# Patient Record
Sex: Female | Born: 1960 | Race: White | Hispanic: No | Marital: Married | Smoking: Never smoker
Health system: Southern US, Community
[De-identification: ages and names within clinical notes are randomized; demographics above are authoritative.]

## PROBLEM LIST (undated history)

## (undated) DIAGNOSIS — E785 Hyperlipidemia, unspecified: Secondary | ICD-10-CM

## (undated) DIAGNOSIS — R0602 Shortness of breath: Secondary | ICD-10-CM

## (undated) DIAGNOSIS — R002 Palpitations: Secondary | ICD-10-CM

## (undated) DIAGNOSIS — E079 Disorder of thyroid, unspecified: Secondary | ICD-10-CM

## (undated) DIAGNOSIS — I10 Essential (primary) hypertension: Secondary | ICD-10-CM

## (undated) HISTORY — DX: Shortness of breath: R06.02

## (undated) HISTORY — PX: KNEE ARTHROSCOPY: SUR90

## (undated) HISTORY — DX: Palpitations: R00.2

---

## 2018-05-11 ENCOUNTER — Emergency Department (HOSPITAL_COMMUNITY)
Admission: EM | Admit: 2018-05-11 | Discharge: 2018-05-11 | Disposition: A | Discharge status: Home / Self Care | Payer: BLUE CROSS/BLUE SHIELD | Attending: Emergency Medicine | Admitting: Emergency Medicine

## 2018-05-11 ENCOUNTER — Encounter (HOSPITAL_COMMUNITY): Payer: Self-pay | Admitting: Emergency Medicine

## 2018-05-11 ENCOUNTER — Other Ambulatory Visit: Payer: Self-pay

## 2018-05-11 ENCOUNTER — Emergency Department (HOSPITAL_COMMUNITY): Payer: BLUE CROSS/BLUE SHIELD

## 2018-05-11 DIAGNOSIS — I10 Essential (primary) hypertension: Secondary | ICD-10-CM | POA: Diagnosis not present

## 2018-05-11 DIAGNOSIS — R42 Dizziness and giddiness: Secondary | ICD-10-CM

## 2018-05-11 DIAGNOSIS — Z79899 Other long term (current) drug therapy: Secondary | ICD-10-CM | POA: Diagnosis not present

## 2018-05-11 HISTORY — DX: Essential (primary) hypertension: I10

## 2018-05-11 HISTORY — DX: Hyperlipidemia, unspecified: E78.5

## 2018-05-11 HISTORY — DX: Disorder of thyroid, unspecified: E07.9

## 2018-05-11 LAB — CBC WITH DIFFERENTIAL/PLATELET
Basophils Absolute: 0 10*3/uL (ref 0.0–0.1)
Basophils Relative: 0 %
EOS PCT: 1 %
Eosinophils Absolute: 0.1 10*3/uL (ref 0.0–0.7)
HCT: 38 % (ref 36.0–46.0)
Hemoglobin: 12.4 g/dL (ref 12.0–15.0)
Lymphocytes Relative: 18 %
Lymphs Abs: 1.1 10*3/uL (ref 0.7–4.0)
MCH: 28.8 pg (ref 26.0–34.0)
MCHC: 32.6 g/dL (ref 30.0–36.0)
MCV: 88.4 fL (ref 78.0–100.0)
Monocytes Absolute: 0.4 10*3/uL (ref 0.1–1.0)
Monocytes Relative: 6 %
Neutro Abs: 4.5 10*3/uL (ref 1.7–7.7)
Neutrophils Relative %: 75 %
PLATELETS: 198 10*3/uL (ref 150–400)
RBC: 4.3 MIL/uL (ref 3.87–5.11)
RDW: 13.9 % (ref 11.5–15.5)
WBC: 6 10*3/uL (ref 4.0–10.5)

## 2018-05-11 LAB — COMPREHENSIVE METABOLIC PANEL
ALK PHOS: 144 U/L — AB (ref 38–126)
ALT: 47 U/L (ref 14–54)
AST: 31 U/L (ref 15–41)
Albumin: 3.8 g/dL (ref 3.5–5.0)
Anion gap: 7 (ref 5–15)
BUN: 15 mg/dL (ref 6–20)
CALCIUM: 9.5 mg/dL (ref 8.9–10.3)
CHLORIDE: 109 mmol/L (ref 101–111)
CO2: 26 mmol/L (ref 22–32)
CREATININE: 0.84 mg/dL (ref 0.44–1.00)
GFR calc non Af Amer: >60 mL/min (ref >60)
Glucose, Bld: 100 mg/dL — ABNORMAL HIGH (ref 65–99)
Potassium: 3.8 mmol/L (ref 3.5–5.1)
Sodium: 142 mmol/L (ref 135–145)
TOTAL PROTEIN: 7.1 g/dL (ref 6.5–8.1)
Total Bilirubin: 1.3 mg/dL — ABNORMAL HIGH (ref 0.3–1.2)

## 2018-05-11 MED ORDER — SCOPOLAMINE 1 MG/3DAYS TD PT72
1.0000 | MEDICATED_PATCH | Freq: Once | TRANSDERMAL | Status: DC
  Administered 2018-05-11: 1.5 mg via TRANSDERMAL
  Filled 2018-05-11: qty 1

## 2018-05-11 MED ORDER — DIPHENHYDRAMINE HCL 50 MG/ML IJ SOLN
25.0000 mg | Freq: Once | INTRAMUSCULAR | Status: AC
  Administered 2018-05-11: 25 mg via INTRAVENOUS
  Filled 2018-05-11: qty 1

## 2018-05-11 MED ORDER — PROMETHAZINE HCL 25 MG/ML IJ SOLN
12.5000 mg | Freq: Once | INTRAMUSCULAR | Status: AC
  Administered 2018-05-11: 12.5 mg via INTRAVENOUS
  Filled 2018-05-11: qty 1

## 2018-05-11 MED ORDER — MECLIZINE HCL 12.5 MG PO TABS
25.0000 mg | ORAL_TABLET | Freq: Once | ORAL | Status: AC
  Administered 2018-05-11: 25 mg via ORAL
  Filled 2018-05-11: qty 2

## 2018-05-11 MED ORDER — ONDANSETRON HCL 4 MG/2ML IJ SOLN
4.0000 mg | Freq: Once | INTRAMUSCULAR | Status: AC
  Administered 2018-05-11: 4 mg via INTRAVENOUS
  Filled 2018-05-11: qty 2

## 2018-05-11 MED ORDER — PROMETHAZINE HCL 25 MG PO TABS: 25.0000 mg | ORAL_TABLET | Freq: Four times a day (QID) | ORAL | 0 refills | Status: DC | PRN

## 2018-05-11 MED ORDER — DIAZEPAM 5 MG PO TABS: 5.0000 mg | ORAL_TABLET | Freq: Every evening | ORAL | 0 refills | Status: DC | PRN

## 2018-05-11 MED ORDER — MECLIZINE HCL 25 MG PO TABS: 25.0000 mg | ORAL_TABLET | Freq: Three times a day (TID) | ORAL | 0 refills | Status: DC | PRN

## 2018-05-11 MED ORDER — DIAZEPAM 5 MG/ML IJ SOLN
5.0000 mg | Freq: Once | INTRAMUSCULAR | Status: AC
Start: 2018-05-11 — End: 2018-05-11
  Administered 2018-05-11: 5 mg via INTRAVENOUS
  Filled 2018-05-11: qty 2

## 2018-05-11 NOTE — ED Notes (Signed)
Daughter reports pt having U/S of liver in April d/t elevated ALTs.

## 2018-05-11 NOTE — ED Provider Notes (Signed)
[facility] EMERGENCY DEPARTMENT Provider Note   CSN: [id number] Arrival date & time: 05/11/18  8295     History   Chief Complaint Chief Complaint  Patient presents with  . Dizziness    HPI Barbara Mills is a 57 y.o. female.  If complaint is dizziness, spinning, and nausea.  HPI: 57 year old female.  Had some back pain after doing some yard work over the last few days.  Otherwise she has been in her normal state of health.  She awakened at 538 this morning spontaneously.  She turned over in bed, awaiting her alarm.  When she did she felt spinning and vertigo.  She has had nausea since with 2 episodes of vomiting.  She cannot lay flat without marked vertigo.  Previous episodes.  She does not have a headache.  She does not have peripheral numbness weakness.  Brought in by family.  Of hypertension, and cholesterol.  No history of heart, vascular, cerebrovascular, or renal disease.  Is a lifetime non-smoker  No tinnitus.  No hearing loss.  No ear pain.  No URI symptoms or allergic symptoms  Past Medical History:  Diagnosis Date  . Hyperlipidemia   . Hypertension   . Thyroid disease     There are no active problems to display for this patient.   Past Surgical History:  Procedure Laterality Date  . KNEE ARTHROSCOPY Left      OB History    Gravida  2   Para      Term      Preterm      AB      Living        SAB      TAB      Ectopic      Multiple      Live Births               Home Medications    Prior to Admission medications   Medication Sig Start Date End Date Taking? Authorizing Provider  calcium carbonate (TUMS - DOSED IN MG ELEMENTAL CALCIUM) 500 MG chewable tablet Chew 2 tablets by mouth daily as needed for indigestion or heartburn.   Yes [provider]  levothyroxine (SYNTHROID, LEVOTHROID) 75 MCG tablet Take 1 tablet by mouth daily. 04/15/18  Yes [provider]  losartan-hydrochlorothiazide (HYZAAR) 100-12.5 MG tablet  Take 1 tablet by mouth daily. 05/10/18  Yes [provider]  simvastatin (ZOCOR) 20 MG tablet Take 1 tablet by mouth daily. 02/25/18  Yes [provider]  diazepam (VALIUM) 5 MG tablet Take 1 tablet (5 mg total) by mouth at bedtime as needed (Vertigo). 05/11/18   Rolland Porter, MD  meclizine (ANTIVERT) 25 MG tablet Take 1 tablet (25 mg total) by mouth 3 (three) times daily as needed for dizziness. 05/11/18   Rolland Porter, MD  promethazine (PHENERGAN) 25 MG tablet Take 1 tablet (25 mg total) by mouth every 6 (six) hours as needed for nausea or vomiting. 05/11/18   Rolland Porter, MD    Family History Family History  Problem Relation Age of Onset  . Hypertension Mother   . Thyroid disease Mother   . Cancer Mother   . Osteoarthritis Mother   . Heart failure Father   . Cancer Father   . Hypertension Father   . Osteoarthritis Sister   . Hypertension Sister   . Thyroid disease Sister   . Osteoarthritis Brother   . Hypertension Brother     Social History Social History  Tobacco Use  . Smoking status: Never Smoker  . Smokeless tobacco: Never Used  Substance Use Topics  . Alcohol use: Never    Frequency: Never  . Drug use: Never     Allergies   Patient has no known allergies.   Review of Systems Review of Systems  Constitutional: Negative for appetite change, chills, diaphoresis, fatigue and fever.  HENT: Negative for mouth sores, sore throat and trouble swallowing.   Eyes: Negative for visual disturbance.  Respiratory: Negative for cough, chest tightness, shortness of breath and wheezing.   Cardiovascular: Negative for chest pain.  Gastrointestinal: Positive for nausea and vomiting. Negative for abdominal distention, abdominal pain and diarrhea.  Endocrine: Negative for polydipsia, polyphagia and polyuria.  Genitourinary: Negative for dysuria, frequency and hematuria.  Musculoskeletal: Negative for gait problem.  Skin: Negative for color change, pallor and rash.    Neurological: Positive for dizziness. Negative for syncope, light-headedness and headaches.  Hematological: Does not bruise/bleed easily.  Psychiatric/Behavioral: Negative for behavioral problems and confusion.     Physical Exam Updated Vital Signs BP 116/67   Pulse 64   Temp 97.9 F (36.6 C) (Oral)   Resp 15   Ht 5\' 6"  (1.676 m)   Wt 108 kg (238 lb)   SpO2 98%   BMI 38.41 kg/m   Physical Exam  Constitutional: She is oriented to person, place, and time. She appears well-developed and well-nourished. No distress.  HENT:  Head: Normocephalic.  Eyes: Pupils are equal, round, and reactive to light. Conjunctivae are normal. No scleral icterus.  Inducible nystagmus to lateral gaze symmetric bilateral.  Neck: Normal range of motion. Neck supple. No thyromegaly present.  Cardiovascular: Normal rate and regular rhythm. Exam reveals no gallop and no friction rub.  No murmur heard. Pulmonary/Chest: Effort normal and breath sounds normal. No respiratory distress. She has no wheezes. She has no rales.  Abdominal: Soft. Bowel sounds are normal. She exhibits no distension. There is no tenderness. There is no rebound.  Musculoskeletal: Normal range of motion.  Neurological: She is alert and oriented to person, place, and time.  Symmetric cranial nerve exam.  Normal with exception of nystagmus.  Strength and sensation of the 4 extremities without drift.  Skin: Skin is warm and dry. No rash noted.  Psychiatric: She has a normal mood and affect. Her behavior is normal.     ED Treatments / Results  Labs (all labs ordered are listed, but only abnormal results are displayed) Labs Reviewed  COMPREHENSIVE METABOLIC PANEL - Abnormal; Notable for the following components:      Result Value   Glucose, Bld 100 (*)    Alkaline Phosphatase 144 (*)    Total Bilirubin 1.3 (*)    All other components within normal limits  CBC WITH DIFFERENTIAL/PLATELET    EKG None  Radiology Ct Head Wo  Contrast  Result Date: 05/11/2018 CLINICAL DATA:  Dizziness and nausea EXAM: CT HEAD WITHOUT CONTRAST TECHNIQUE: Contiguous axial images were obtained from the base of the skull through the vertex without intravenous contrast. COMPARISON:  None. FINDINGS: Brain: No evidence of acute infarction, hemorrhage, hydrocephalus, extra-axial collection or mass lesion/mass effect. Vascular: Negative for hyperdense vessel Skull: Negative Sinuses/Orbits: Negative Other: None IMPRESSION: Negative CT head Electronically Signed   By: Marlan Palauharles  Clark M.D.   On: 05/11/2018 09:14    Procedures Procedures (including critical care time)  Medications Ordered in ED Medications  scopolamine (TRANSDERM-SCOP) 1 MG/3DAYS 1.5 mg (1.5 mg Transdermal Patch Applied 05/11/18 1118)  diphenhydrAMINE (  BENADRYL) injection 25 mg (25 mg Intravenous Given 05/11/18 0809)  ondansetron (ZOFRAN) injection 4 mg (4 mg Intravenous Given 05/11/18 0806)  meclizine (ANTIVERT) tablet 25 mg (25 mg Oral Given 05/11/18 0800)  promethazine (PHENERGAN) injection 12.5 mg (12.5 mg Intravenous Given 05/11/18 0933)  diazepam (VALIUM) injection 5 mg (5 mg Intravenous Given 05/11/18 1059)     Initial Impression / Assessment and Plan / ED Course  I have reviewed the triage vital signs and the nursing notes.  Pertinent labs & imaging results that were available during my care of the patient were reviewed by me and considered in my medical decision making (see chart for details).    Acute peripheral vertigo.  Plan CT rule out hemorrhage.  Plan symptom control Zofran, Benadryl, meclizine.  Reevaluation after the above.  Ultimately patient was symptom relief after Phenergan, Zofran, Valium, and scopolamine patch.  Ambulatory.  Plan is home.  Cautioned of the sedative effects of all the above medications.  Vertigo precautions.  Meclizine until 24 hours without symptoms.  No driving until 24 hours without symptoms.  Primary care if not improving.  ER with  worsening.   Final Clinical Impressions(s) / ED Diagnoses   Final diagnoses:  Vertigo    ED Discharge Orders        Ordered    promethazine (PHENERGAN) 25 MG tablet  Every 6 hours PRN     05/11/18 1200    meclizine (ANTIVERT) 25 MG tablet  3 times daily PRN     05/11/18 1200    diazepam (VALIUM) 5 MG tablet  At bedtime PRN     05/11/18 1200       Rolland Porter, MD 05/11/18 1203

## 2018-05-11 NOTE — ED Triage Notes (Signed)
Patient complains of nausea and dizziness that started at 6 am this morning. Patient states dizziness is better when she sits up. She says that lying down makes dizziness worse. She has had two bouts of vomiting this morning.

## 2018-05-11 NOTE — Discharge Instructions (Addendum)
Recheck with your primary care physician if not improving. No driving until 24 hours without symptoms. Meclizine until 24 hours without symptoms. All medications prescribed for nausea and vertigo today to have side effects of possible sedation. Scopolamine patch is effective for 72 hours.  It can be removed when you have had 24 hours of symptom relief.  Can also be removed if you are excessively sleepy

## 2018-05-11 NOTE — ED Notes (Signed)
Pt is sitting in bed with family around.  Says she does feel a little better but slightly nauseated.

## 2018-12-22 IMAGING — CT CT HEAD W/O CM
3 series · 15 of 47 positions shown, 18 images · non-contrast
Comparison: None.

CLINICAL DATA: Dizziness and nausea

EXAM:
CT HEAD WITHOUT CONTRAST
TECHNIQUE: Contiguous axial images were obtained from the base of the skull
through the vertex without intravenous contrast.

[Series 2: head wo · axial · 0.43mm/px · z∈[+1741,+1866]mm · 9 of 31 slices shown, 12 images]
[im 3/31  brain]
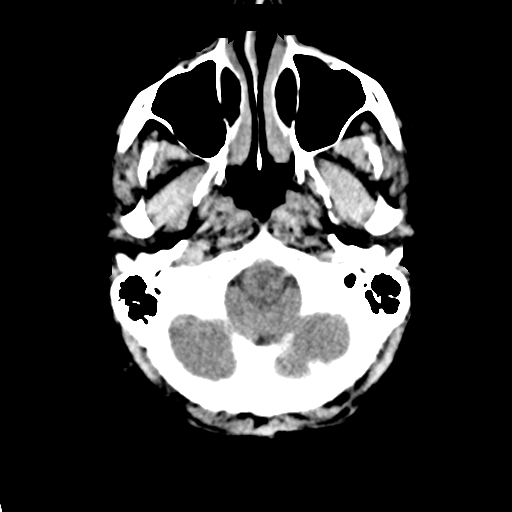
[im 3/31  bone]
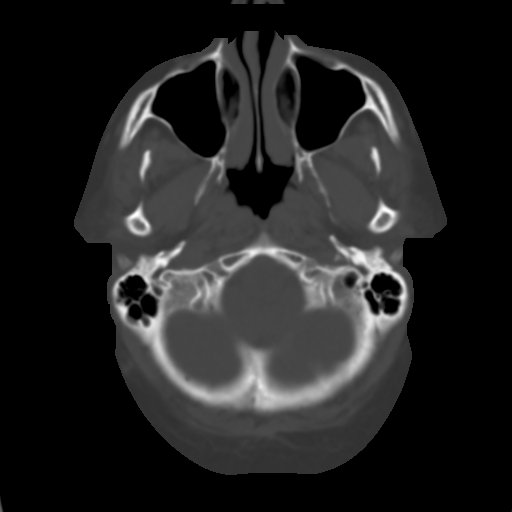
[im 6/31  brain]
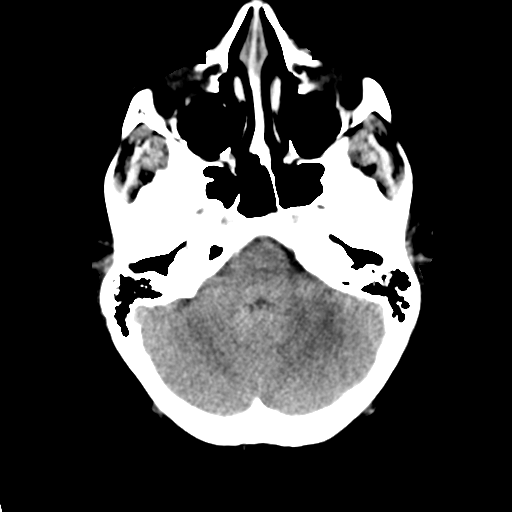
[im 9/31  brain]
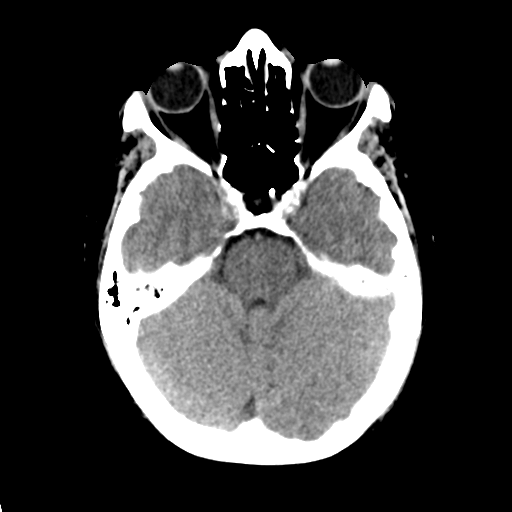
[im 12/31  brain]
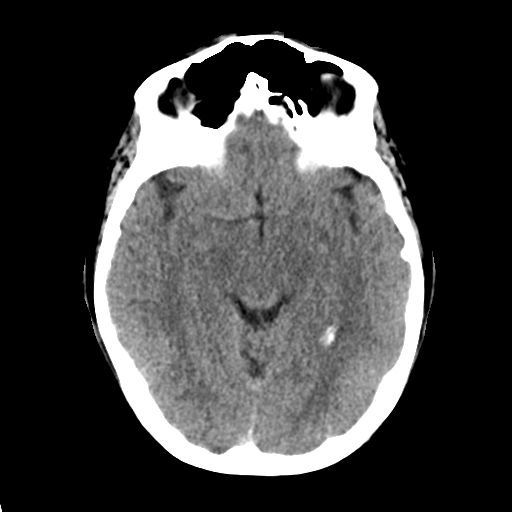
[im 16/31  brain]
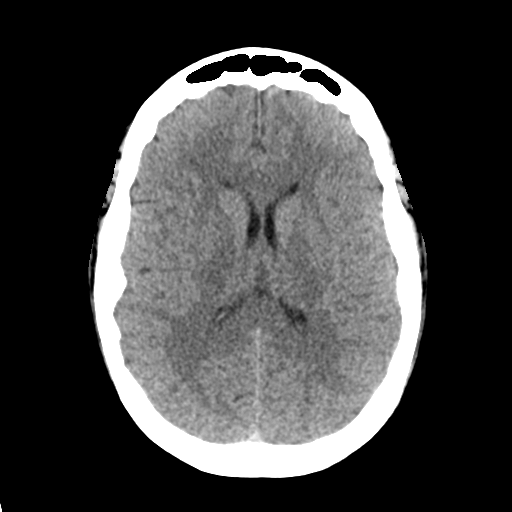
[im 16/31  bone]
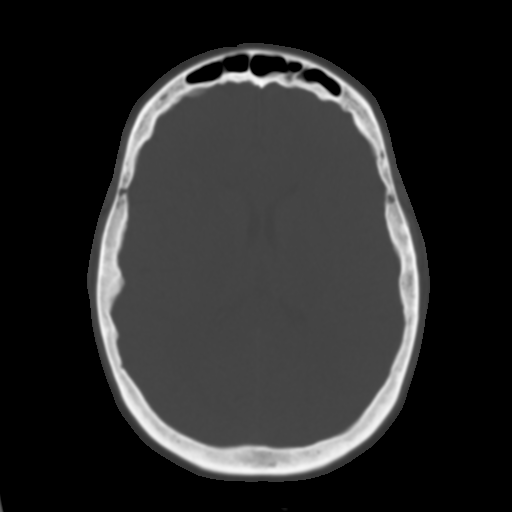
[im 19/31  brain]
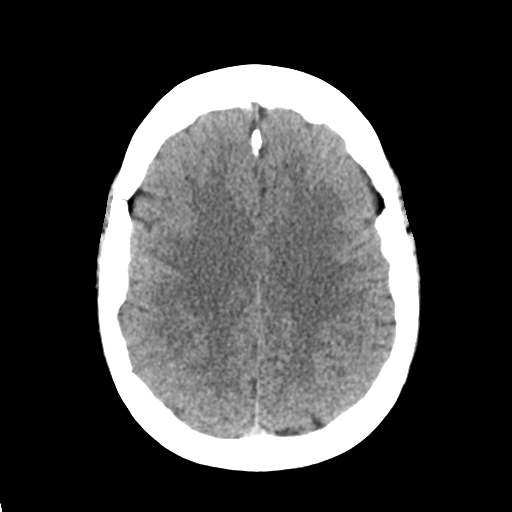
[im 22/31  brain]
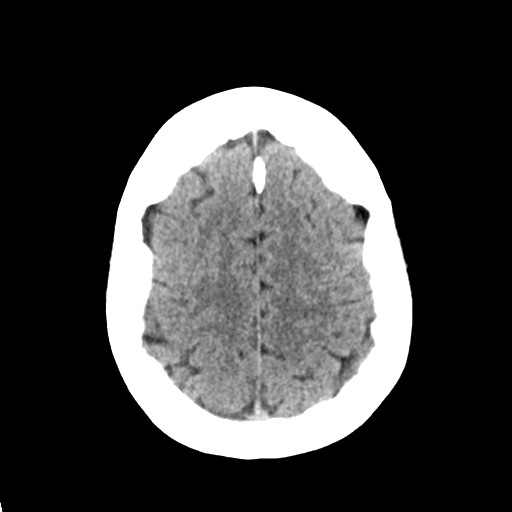
[im 25/31  brain]
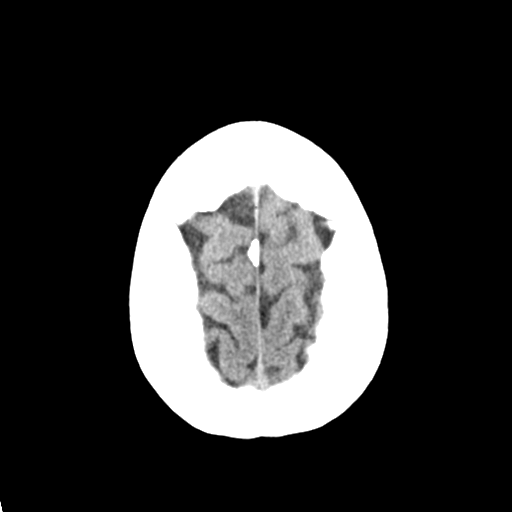
[im 28/31  brain]
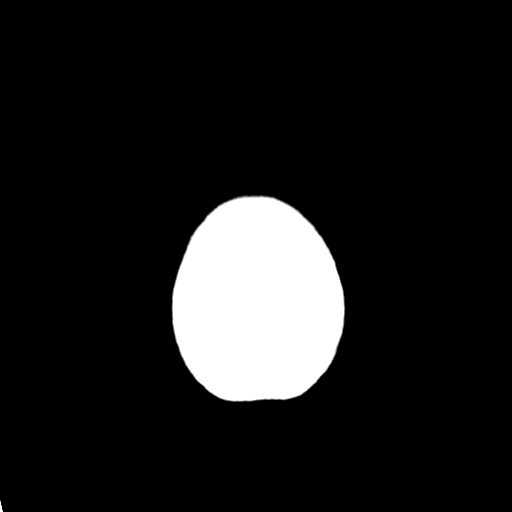
[im 28/31  bone]
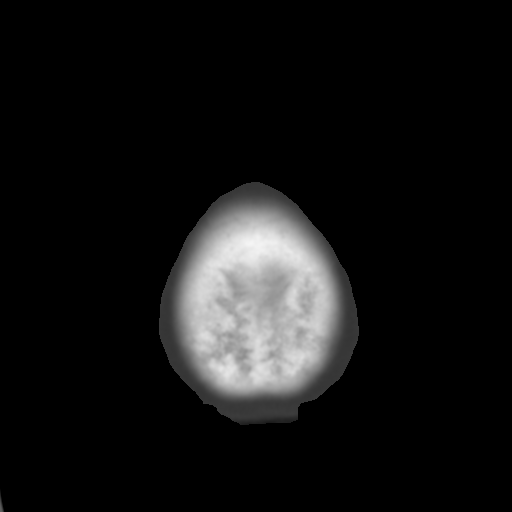

[Series 4: coronal soft tissue · coronal · 0.31mm/px · 3 of 67 slices shown]
[im 23/67  brain]
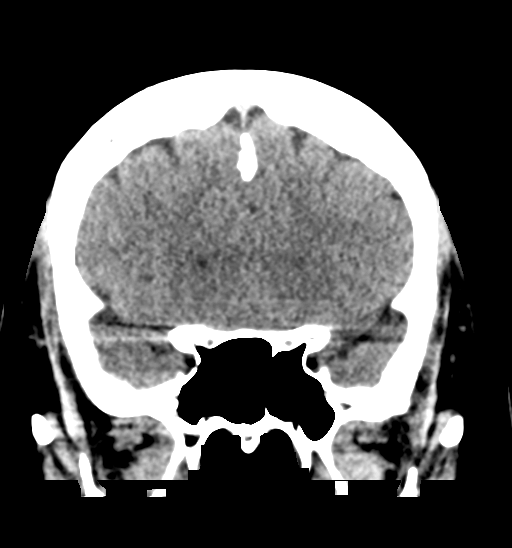
[im 30/67  brain]
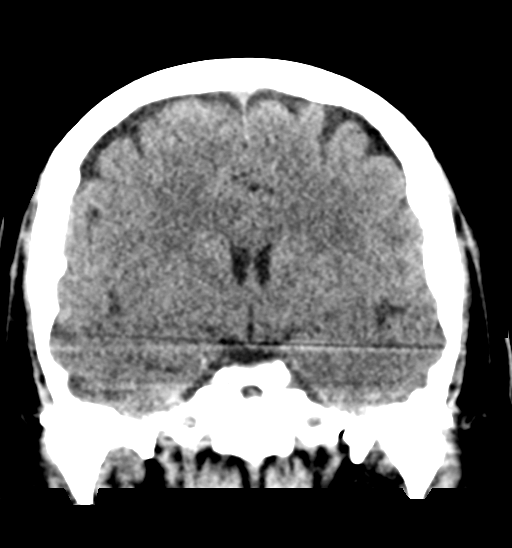
[im 37/67  brain]
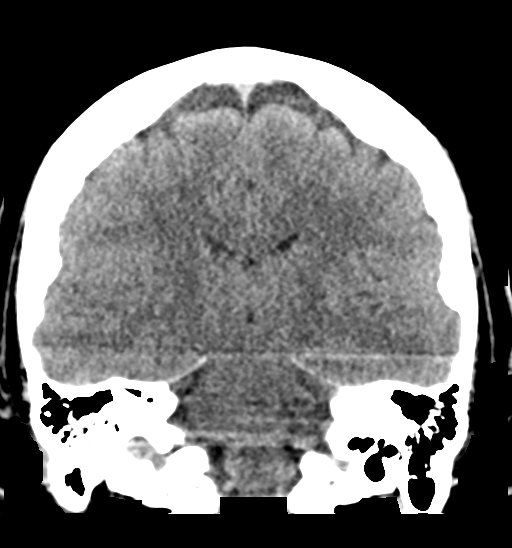

[Series 5: sagittal soft tissue · sagittal · 0.31mm/px · 3 of 53 slices shown]
[im 18/53  brain]
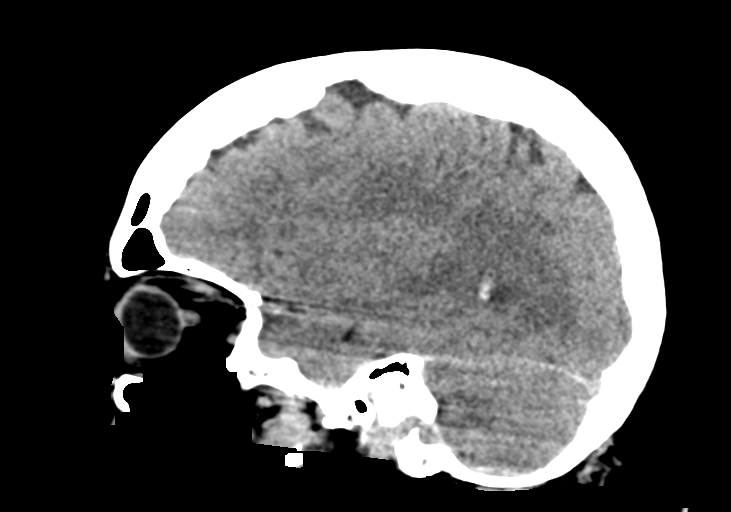
[im 27/53  brain]
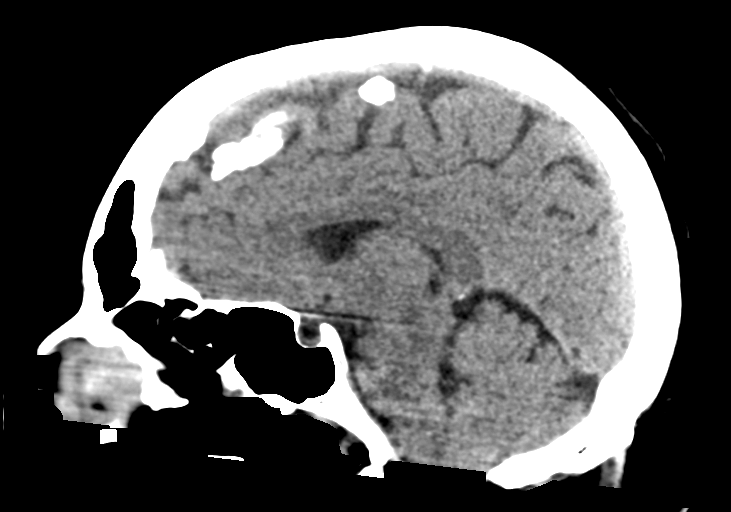
[im 35/53  brain]
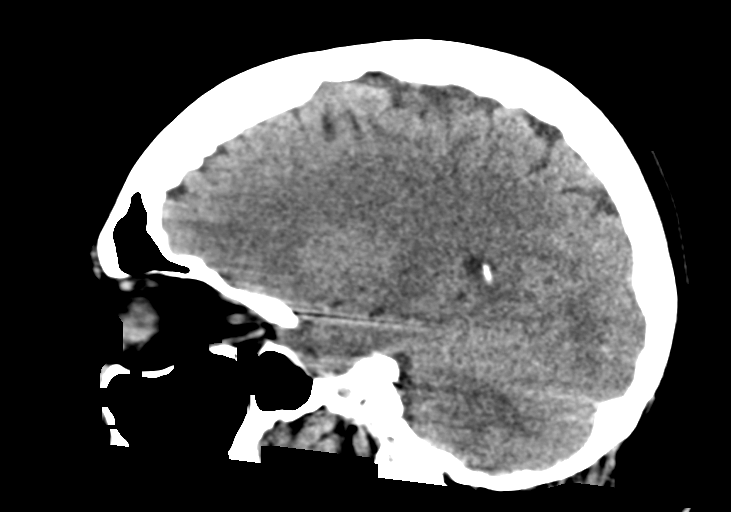

[15 of 47 positions shown; findings below may reference images not displayed]

FINDINGS: Brain: No evidence of acute infarction, hemorrhage, hydrocephalus,
extra-axial collection or mass lesion/mass effect.

Vascular: Negative for hyperdense vessel

Skull: Negative

Sinuses/Orbits: Negative

Other: None
IMPRESSION: Negative CT head

## 2021-05-17 ENCOUNTER — Telehealth: Payer: Self-pay

## 2021-05-17 NOTE — Telephone Encounter (Signed)
NOTES ON FILE FROM [facility] PATIENT CARE [phone], SENT REFERRAL TO SCHEDULING

## 2021-05-20 ENCOUNTER — Telehealth: Payer: Self-pay

## 2021-05-20 NOTE — Telephone Encounter (Signed)
Pt needs a scheduling appt. Progress notes on file sent from; [facility] Patient care, phone [phone]. A copy of referral order was sent to scheduling

## 2021-06-05 ENCOUNTER — Encounter: Payer: Self-pay | Admitting: *Deleted

## 2021-08-27 ENCOUNTER — Telehealth: Payer: Self-pay

## 2021-08-27 NOTE — Telephone Encounter (Signed)
SCANNED NOTES TO REFERRAL RJ 

## 2024-01-08 ENCOUNTER — Encounter: Payer: Self-pay | Admitting: Neurology

## 2024-01-21 NOTE — Progress Notes (Signed)
 Assessment/Plan:   Benign fasciculation, inferior orbicularis oculi  -No evidence of hemifacial spasm today.  I did note a benign fasciculation at the medial aspect of the inferior orbicularis oculi.  She and I discussed that this is a benign phenomenon.  We discussed the difference between hemifacial spasm and benign fasciculations.  Discussed that caffeine, fatigue, stress can all play a role.  Discussed role of warm compresses.  Very rarely, we will use Botox, but this is generally not paid for by insurance, and she agrees that this is not necessary at this time.  Certainly, I told her if things progress or if new symptoms develop, I would want to see her back.  Otherwise, she will follow-up on an as-needed basis. Subjective:   Barbara Mills was seen today in neurologic consultation at the request of Reina Fuse, NP.  The consultation is for the evaluation of hemifacial spasm on the left.  Patient was seen by above provider at ENT and MRI of the brain was ordered with and without gadolinium.  I do not have a copy of those films but I did call on Friday to get a copy of the report.  Report stated that it was unremarkable.  Pt states that 3 months ago her eye started twitching and its gotten a little worse.  She rarely will notice it at the corner of the mouth (maybe one time every 5 days).  Nighttime seems worse.  She doesn't note that moving the face with laugh/cough/sneeze will make worse.  There is no eyelid droop with it.  She notes it is just the lower eyelid.  Nothing along zygomaticus.  No diplopia.    ALLERGIES:  No Known Allergies  CURRENT MEDICATIONS:  Outpatient Encounter Medications as of 01/25/2024  Medication Sig   levothyroxine (SYNTHROID, LEVOTHROID) 75 MCG tablet Take 1 tablet by mouth daily.   losartan-hydrochlorothiazide (HYZAAR) 100-12.5 MG tablet Take 1 tablet by mouth daily.   rosuvastatin (CRESTOR) 10 MG tablet Take 1 tablet by mouth daily.   calcium carbonate  (TUMS - DOSED IN MG ELEMENTAL CALCIUM) 500 MG chewable tablet Chew 2 tablets by mouth daily as needed for indigestion or heartburn. (Patient not taking: Reported on 01/25/2024)   [DISCONTINUED] diazepam (VALIUM) 5 MG tablet Take 1 tablet (5 mg total) by mouth at bedtime as needed (Vertigo). (Patient not taking: Reported on 01/25/2024)   [DISCONTINUED] meclizine (ANTIVERT) 25 MG tablet Take 1 tablet (25 mg total) by mouth 3 (three) times daily as needed for dizziness. (Patient not taking: Reported on 01/25/2024)   [DISCONTINUED] promethazine (PHENERGAN) 25 MG tablet Take 1 tablet (25 mg total) by mouth every 6 (six) hours as needed for nausea or vomiting. (Patient not taking: Reported on 01/25/2024)   [DISCONTINUED] simvastatin (ZOCOR) 20 MG tablet Take 1 tablet by mouth daily. (Patient not taking: Reported on 01/25/2024)   No facility-administered encounter medications on file as of 01/25/2024.    Objective:   PHYSICAL EXAMINATION:    VITALS:   Vitals:   01/25/24 0957  BP: 128/70  Pulse: 97  SpO2: 99%  Weight: 240 lb (108.9 kg)  Height: 5' 6.5" (1.689 m)    GEN:  Normal appears female in no acute distress.  Appears stated age. HEENT:  Normocephalic, atraumatic. The mucous membranes are moist. The superficial temporal arteries are without ropiness or tenderness. Cardiovascular: Regular rate and rhythm. Lungs: Clear to auscultation bilaterally. Neck/Heme: There are no carotid bruits noted bilaterally.  NEUROLOGICAL: Orientation:  The patient is alert  and oriented x 3.   Cranial nerves: There is good facial symmetry.  After having her squeeze her eyes tightly shut and then she opens them, she does have a benign fasciculation appear at the inferior, medial orbicularis oculi.  No other abnormal movements are noted across the face.  Extraocular muscles are intact and visual fields are full to confrontational testing. Speech is fluent and clear. Soft palate rises symmetrically and there is no  tongue deviation. Hearing is intact to conversational tone. Tone: Tone is good throughout. Sensation: Sensation is intact to light touch and pinprick throughout (facial, trunk, extremities). Vibration is intact at the bilateral big toe. There is no extinction with double simultaneous stimulation. There is no sensory dermatomal level identified. Coordination:  The patient has no difficulty with RAM's or FNF bilaterally. Motor: Strength is 5/5 in the bilateral upper and lower extremities.  Shoulder shrug is equal and symmetric. There is no pronator drift.  There are no fasciculations noted. DTR's: Deep tendon reflexes are 2/4 at the bilateral biceps, triceps, brachioradialis, patella and achilles.  Plantar responses are downgoing bilaterally. Gait and Station: The patient is able to ambulate without difficulty.    Total time spent on today's visit was 45 minutes, including both face-to-face time and nonface-to-face time.  Time included that spent on review of records (prior notes available to me/labs/imaging if pertinent), discussing treatment and goals, answering patient's questions and coordinating care.    Cc:  System, Provider Not In

## 2024-01-25 ENCOUNTER — Encounter: Payer: Self-pay | Admitting: Neurology

## 2024-01-25 ENCOUNTER — Ambulatory Visit: Payer: BC Managed Care – PPO | Admitting: Neurology

## 2024-01-25 VITALS — BP 128/70 | HR 97 | Ht 66.5 in | Wt 240.0 lb

## 2024-01-25 DIAGNOSIS — R253 Fasciculation: Secondary | ICD-10-CM

## 2024-01-25 NOTE — Patient Instructions (Addendum)
 You have benign fasciculation of the eye.  This is benign.  It was my pleasure to see you today!  The physicians and staff at [facility] are committed to providing excellent care. You may receive a survey requesting feedback about your experience at our office. We strive to receive "very good" responses to the survey questions. If you feel that your experience would prevent you from giving the office a "very good " response, please contact our office to try to remedy the situation. We may be reached at [phone]. Thank you for taking the time out of your busy day to complete the survey.
